# Patient Record
Sex: Male | Born: 1985 | Race: White | Hispanic: No | Marital: Married | Smoking: Current every day smoker
Health system: Southern US, Community
[De-identification: ages and names within clinical notes are randomized; demographics above are authoritative.]

---

## 2019-05-02 ENCOUNTER — Other Ambulatory Visit: Payer: Self-pay | Admitting: *Deleted

## 2019-05-02 DIAGNOSIS — Z20822 Contact with and (suspected) exposure to covid-19: Secondary | ICD-10-CM

## 2019-05-08 LAB — NOVEL CORONAVIRUS, NAA: SARS-CoV-2, NAA: NOT DETECTED

## 2019-08-21 ENCOUNTER — Other Ambulatory Visit: Payer: Self-pay

## 2019-08-21 ENCOUNTER — Ambulatory Visit (HOSPITAL_COMMUNITY)
Admission: EM | Admit: 2019-08-21 | Discharge: 2019-08-21 | Disposition: A | Discharge status: Home / Self Care | Payer: 59 | Attending: Family Medicine | Admitting: Family Medicine

## 2019-08-21 ENCOUNTER — Ambulatory Visit (INDEPENDENT_AMBULATORY_CARE_PROVIDER_SITE_OTHER): Payer: 59

## 2019-08-21 ENCOUNTER — Encounter (HOSPITAL_COMMUNITY): Payer: Self-pay

## 2019-08-21 DIAGNOSIS — S62352A Nondisplaced fracture of shaft of third metacarpal bone, right hand, initial encounter for closed fracture: Secondary | ICD-10-CM

## 2019-08-21 DIAGNOSIS — S62324A Displaced fracture of shaft of fourth metacarpal bone, right hand, initial encounter for closed fracture: Secondary | ICD-10-CM | POA: Diagnosis not present

## 2019-08-21 MED ORDER — IBUPROFEN 800 MG PO TABS: 800.0000 mg | ORAL_TABLET | Freq: Three times a day (TID) | ORAL | 0 refills | Status: AC

## 2019-08-21 MED ORDER — HYDROCODONE-ACETAMINOPHEN 7.5-325 MG PO TABS: 1.0000 | ORAL_TABLET | Freq: Four times a day (QID) | ORAL | 0 refills | Status: AC | PRN
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
## 2019-08-21 NOTE — Discharge Instructions (Signed)
Use ice to reduce pain and swelling Leave splint on at all times Elevate above level of heart Take ibuprofen 3 times a day with food.  This is for moderate pain Take hydrocodone as needed for severe pain.  Do not take hydrocodone and drive Call Dr. Biagio Borg office in the morning to set up an appointment for later this week
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
